# Patient Record
Sex: Female | Born: 1955 | Race: Black or African American | Hispanic: No | Marital: Single | Smoking: Current every day smoker
Health system: Southern US, Community
[De-identification: ages and names within clinical notes are randomized; demographics above are authoritative.]

## PROBLEM LIST (undated history)

## (undated) DIAGNOSIS — E119 Type 2 diabetes mellitus without complications: Secondary | ICD-10-CM

## (undated) DIAGNOSIS — R42 Dizziness and giddiness: Secondary | ICD-10-CM

## (undated) DIAGNOSIS — M549 Dorsalgia, unspecified: Secondary | ICD-10-CM

## (undated) DIAGNOSIS — I10 Essential (primary) hypertension: Secondary | ICD-10-CM

## (undated) HISTORY — PX: BACK SURGERY: SHX140

## (undated) HISTORY — PX: WRIST SURGERY: SHX841

## (undated) HISTORY — PX: ABDOMINAL HYSTERECTOMY: SHX81

---

## 2006-06-12 ENCOUNTER — Ambulatory Visit: Payer: Self-pay | Admitting: Gastroenterology

## 2006-09-21 ENCOUNTER — Ambulatory Visit: Payer: Self-pay | Admitting: Internal Medicine

## 2006-09-21 ENCOUNTER — Encounter (INDEPENDENT_AMBULATORY_CARE_PROVIDER_SITE_OTHER): Payer: Self-pay | Admitting: Internal Medicine

## 2006-09-21 LAB — CONVERTED CEMR LAB
AST: 26 units/L (ref 0–37)
Albumin: 3.7 g/dL (ref 3.5–5.2)
Alkaline Phosphatase: 167 units/L — ABNORMAL HIGH (ref 39–117)
BUN: 14 mg/dL (ref 6–23)
Creatinine, Ser: 0.89 mg/dL (ref 0.40–1.20)
Hemoglobin: 12.9 g/dL (ref 12.0–15.0)
MCHC: 33.2 g/dL (ref 30.0–36.0)
MCV: 92.8 fL (ref 78.0–100.0)
Potassium: 3.7 meq/L (ref 3.5–5.3)
RDW: 14.6 % — ABNORMAL HIGH (ref 11.5–14.0)
Total Bilirubin: 0.3 mg/dL (ref 0.3–1.2)

## 2006-11-01 ENCOUNTER — Ambulatory Visit: Payer: Self-pay | Admitting: Internal Medicine

## 2006-11-01 ENCOUNTER — Inpatient Hospital Stay (HOSPITAL_COMMUNITY)
Admission: AD | Admit: 2006-11-01 | Discharge: 2006-11-03 | Payer: Self-pay | Attending: Internal Medicine | Admitting: Internal Medicine

## 2006-11-01 ENCOUNTER — Ambulatory Visit: Payer: Self-pay | Admitting: Hospitalist

## 2010-12-18 ENCOUNTER — Encounter: Payer: Self-pay | Admitting: Gastroenterology

## 2010-12-28 ENCOUNTER — Other Ambulatory Visit: Payer: Self-pay

## 2010-12-28 DIAGNOSIS — Z139 Encounter for screening, unspecified: Secondary | ICD-10-CM

## 2011-01-02 ENCOUNTER — Ambulatory Visit (HOSPITAL_BASED_OUTPATIENT_CLINIC_OR_DEPARTMENT_OTHER): Payer: Self-pay

## 2011-01-04 ENCOUNTER — Ambulatory Visit (HOSPITAL_BASED_OUTPATIENT_CLINIC_OR_DEPARTMENT_OTHER)
Admission: RE | Admit: 2011-01-04 | Discharge: 2011-01-04 | Disposition: A | Discharge status: Home / Self Care | Payer: Self-pay | Source: Ambulatory Visit | Attending: Unknown Physician Specialty | Admitting: Unknown Physician Specialty

## 2011-01-04 ENCOUNTER — Encounter (HOSPITAL_BASED_OUTPATIENT_CLINIC_OR_DEPARTMENT_OTHER): Payer: Self-pay

## 2011-01-04 DIAGNOSIS — Z1231 Encounter for screening mammogram for malignant neoplasm of breast: Secondary | ICD-10-CM | POA: Insufficient documentation

## 2011-01-04 DIAGNOSIS — Z139 Encounter for screening, unspecified: Secondary | ICD-10-CM

## 2011-03-20 ENCOUNTER — Other Ambulatory Visit (HOSPITAL_BASED_OUTPATIENT_CLINIC_OR_DEPARTMENT_OTHER): Payer: Self-pay | Admitting: Unknown Physician Specialty

## 2011-03-20 DIAGNOSIS — Z139 Encounter for screening, unspecified: Secondary | ICD-10-CM

## 2016-11-15 ENCOUNTER — Emergency Department (HOSPITAL_BASED_OUTPATIENT_CLINIC_OR_DEPARTMENT_OTHER)
Admission: EM | Admit: 2016-11-15 | Discharge: 2016-11-15 | Disposition: A | Discharge status: Home / Self Care | Payer: No Typology Code available for payment source | Attending: Emergency Medicine | Admitting: Emergency Medicine

## 2016-11-15 ENCOUNTER — Encounter (HOSPITAL_BASED_OUTPATIENT_CLINIC_OR_DEPARTMENT_OTHER): Payer: Self-pay | Admitting: Emergency Medicine

## 2016-11-15 ENCOUNTER — Emergency Department (HOSPITAL_BASED_OUTPATIENT_CLINIC_OR_DEPARTMENT_OTHER): Payer: No Typology Code available for payment source

## 2016-11-15 DIAGNOSIS — Y939 Activity, unspecified: Secondary | ICD-10-CM | POA: Insufficient documentation

## 2016-11-15 DIAGNOSIS — I10 Essential (primary) hypertension: Secondary | ICD-10-CM | POA: Diagnosis not present

## 2016-11-15 DIAGNOSIS — Y92410 Unspecified street and highway as the place of occurrence of the external cause: Secondary | ICD-10-CM | POA: Diagnosis not present

## 2016-11-15 DIAGNOSIS — R079 Chest pain, unspecified: Secondary | ICD-10-CM | POA: Diagnosis not present

## 2016-11-15 DIAGNOSIS — Y990 Civilian activity done for income or pay: Secondary | ICD-10-CM | POA: Diagnosis not present

## 2016-11-15 DIAGNOSIS — E119 Type 2 diabetes mellitus without complications: Secondary | ICD-10-CM | POA: Insufficient documentation

## 2016-11-15 DIAGNOSIS — S299XXA Unspecified injury of thorax, initial encounter: Secondary | ICD-10-CM | POA: Diagnosis present

## 2016-11-15 DIAGNOSIS — Z79899 Other long term (current) drug therapy: Secondary | ICD-10-CM | POA: Insufficient documentation

## 2016-11-15 DIAGNOSIS — M542 Cervicalgia: Secondary | ICD-10-CM | POA: Insufficient documentation

## 2016-11-15 HISTORY — DX: Type 2 diabetes mellitus without complications: E11.9

## 2016-11-15 HISTORY — DX: Essential (primary) hypertension: I10

## 2016-11-15 MED ORDER — KETOROLAC TROMETHAMINE 60 MG/2ML IM SOLN
30.0000 mg | Freq: Once | INTRAMUSCULAR | Status: AC
Start: 2016-11-15 — End: 2016-11-15
  Administered 2016-11-15: 30 mg via INTRAMUSCULAR
  Filled 2016-11-15: qty 2

## 2016-11-15 MED ORDER — METHOCARBAMOL 500 MG PO TABS: 500.0000 mg | ORAL_TABLET | Freq: Two times a day (BID) | ORAL | 0 refills | Status: DC

## 2016-11-15 MED ORDER — IBUPROFEN 400 MG PO TABS
400.0000 mg | ORAL_TABLET | Freq: Four times a day (QID) | ORAL | 0 refills | Status: DC | PRN
Start: 2016-11-15 — End: 2018-10-23

## 2016-11-15 MED FILL — IBUPROFEN 400 MG TABLET: 400 | 7 days supply | Qty: 30 | Fill #0

## 2016-11-15 MED FILL — METHOCARBAMOL 500 MG TABLET: 500 | 10 days supply | Qty: 20 | Fill #0

## 2016-11-15 NOTE — ED Notes (Signed)
ED Provider at bedside. 
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
## 2016-11-15 NOTE — ED Notes (Signed)
Patient transported to X-ray 
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
## 2017-04-04 ENCOUNTER — Encounter (HOSPITAL_BASED_OUTPATIENT_CLINIC_OR_DEPARTMENT_OTHER): Payer: Self-pay | Admitting: *Deleted

## 2017-04-04 ENCOUNTER — Emergency Department (HOSPITAL_BASED_OUTPATIENT_CLINIC_OR_DEPARTMENT_OTHER)
Admission: EM | Admit: 2017-04-04 | Discharge: 2017-04-04 | Disposition: A | Discharge status: Home / Self Care | Payer: Self-pay | Attending: Emergency Medicine | Admitting: Emergency Medicine

## 2017-04-04 DIAGNOSIS — X58XXXA Exposure to other specified factors, initial encounter: Secondary | ICD-10-CM | POA: Insufficient documentation

## 2017-04-04 DIAGNOSIS — I10 Essential (primary) hypertension: Secondary | ICD-10-CM | POA: Insufficient documentation

## 2017-04-04 DIAGNOSIS — Y939 Activity, unspecified: Secondary | ICD-10-CM | POA: Insufficient documentation

## 2017-04-04 DIAGNOSIS — S0502XA Injury of conjunctiva and corneal abrasion without foreign body, left eye, initial encounter: Secondary | ICD-10-CM | POA: Insufficient documentation

## 2017-04-04 DIAGNOSIS — E119 Type 2 diabetes mellitus without complications: Secondary | ICD-10-CM | POA: Insufficient documentation

## 2017-04-04 DIAGNOSIS — Z79899 Other long term (current) drug therapy: Secondary | ICD-10-CM | POA: Insufficient documentation

## 2017-04-04 DIAGNOSIS — Y929 Unspecified place or not applicable: Secondary | ICD-10-CM | POA: Insufficient documentation

## 2017-04-04 DIAGNOSIS — Y999 Unspecified external cause status: Secondary | ICD-10-CM | POA: Insufficient documentation

## 2017-04-04 MED ORDER — ERYTHROMYCIN 5 MG/GM OP OINT: TOPICAL_OINTMENT | OPHTHALMIC | 0 refills | Status: DC

## 2017-04-04 MED ORDER — TETRACAINE HCL 0.5 % OP SOLN
1.0000 [drp] | Freq: Once | OPHTHALMIC | Status: AC
  Administered 2017-04-04: 1 [drp] via OPHTHALMIC
  Filled 2017-04-04: qty 4

## 2017-04-04 MED ORDER — FLUORESCEIN SODIUM 0.6 MG OP STRP
1.0000 | ORAL_STRIP | Freq: Once | OPHTHALMIC | Status: AC
  Administered 2017-04-04: 1 via OPHTHALMIC
  Filled 2017-04-04: qty 1

## 2017-04-04 NOTE — ED Provider Notes (Signed)
[facility]   CSN: [id number] Arrival date & time: 04/04/17  1300     History   Chief Complaint Chief Complaint  Patient presents with  . Eye Problem    HPI Becky James is a 61 y.o. female presenting with gradual onset of worsening left eye redness and itching. She also reports a foreign body sensation. She explains that she thought it was allergies and has taken her allergy medicine with relief. She states that she explains discomfort when she touches her eye. Denies any pain at this time. She denies visual changes, swelling, fever, chills, nausea, vomiting and doesn't wear contacts.  HPI  Past Medical History:  Diagnosis Date  . Diabetes mellitus without complication (HCC)   . Hypertension     There are no active problems to display for this patient.   Past Surgical History:  Procedure Laterality Date  . ABDOMINAL HYSTERECTOMY    . BACK SURGERY      OB History    No data available       Home Medications    Prior to Admission medications   Medication Sig Start Date End Date Taking? Authorizing Provider  amLODipine (NORVASC) 2.5 MG tablet Take 2.5 mg by mouth daily.    [provider]  cholecalciferol (VITAMIN D) 1000 units tablet Take 1,000 Units by mouth daily.    [provider]  erythromycin ophthalmic ointment Place a 1/2 inch ribbon of ointment into the lower eyelid. 04/04/17   Georgiana ShoreMitchell, Monta Police B, PA-C  ibuprofen (ADVIL,MOTRIN) 400 MG tablet Take 1 tablet (400 mg total) by mouth every 6 (six) hours as needed. 11/15/16   Mesner, Barbara CowerJason, MD  methocarbamol (ROBAXIN) 500 MG tablet Take 1 tablet (500 mg total) by mouth 2 (two) times daily. 11/15/16   Mesner, Barbara CowerJason, MD  metoprolol succinate (TOPROL-XL) 25 MG 24 hr tablet Take 25 mg by mouth daily.    [provider]  naproxen (NAPROSYN) 250 MG tablet Take 250 mg by mouth 2 (two) times daily with a meal.    [provider]    Family  History History reviewed. No pertinent family history.  Social History Social History  Substance Use Topics  . Smoking status: Never Smoker  . Smokeless tobacco: Never Used  . Alcohol use Not on file     Allergies   Patient has no known allergies.   Review of Systems Review of Systems  Constitutional: Negative for chills and fever.  HENT: Negative for ear pain and facial swelling.   Eyes: Positive for pain, discharge, redness and itching. Negative for photophobia and visual disturbance.  Respiratory: Negative for cough, shortness of breath and wheezing.   Cardiovascular: Negative for chest pain and palpitations.  Gastrointestinal: Negative for nausea and vomiting.  Musculoskeletal: Negative for arthralgias, back pain, myalgias, neck pain and neck stiffness.  Skin: Negative for color change, pallor and rash.  Neurological: Negative for seizures and syncope.  All other systems reviewed and are negative.    Physical Exam Updated Vital Signs BP (!) 142/74 (BP Location: Right Arm)   Pulse 62   Temp 98.3 F (36.8 C) (Oral)   Resp 18   Ht 5\' 7"  (1.702 m)   Wt 129.3 kg   SpO2 95%   BMI 44.64 kg/m   Physical Exam  Constitutional: She appears well-developed and well-nourished. No distress.  Patient is afebrile, nontoxic-appearing, sitting comfortably in chair in no acute distress.  HENT:  Head: Normocephalic and atraumatic.  Eyes: Conjunctivae and  EOM are normal. Pupils are equal, round, and reactive to light. Right eye exhibits no discharge. Left eye exhibits discharge. No scleral icterus.  Left sclerae is injected and some discharge noted in the lower lid. Pupils are round and equal and reactive to light. No edema of the upper or lower eyelid.  Neck: Normal range of motion. Neck supple.  Cardiovascular: Normal rate and regular rhythm.   No murmur heard. Pulmonary/Chest: Effort normal and breath sounds normal. No respiratory distress.  Musculoskeletal: She exhibits no  edema.  Neurological: She is alert.  Skin: Skin is warm and dry. No rash noted. She is not diaphoretic. No erythema. No pallor.  Psychiatric: She has a normal mood and affect.  Nursing note and vitals reviewed.    ED Treatments / Results  Labs (all labs ordered are listed, but only abnormal results are displayed) Labs Reviewed - No data to display  EKG  EKG Interpretation None       Radiology No results found.  Procedures Procedures (including critical care time)  Medications Ordered in ED Medications  fluorescein ophthalmic strip 1 strip (1 strip Left Eye Given 04/04/17 1518)  tetracaine (PONTOCAINE) 0.5 % ophthalmic solution 1 drop (1 drop Left Eye Given 04/04/17 1519)     Initial Impression / Assessment and Plan / ED Course  I have reviewed the triage vital signs and the nursing notes.  Pertinent labs & imaging results that were available during my care of the patient were reviewed by me and considered in my medical decision making (see chart for details).     Patient presents with left eye redness, itching and sensation of foreign body. Not a contact lens wearer. On eyelid eversion no foreign body visualized. Pupils are round and equal and reactive to light without photophobia. No change in vision.   Fluorescein dye and it was labs showing a small corneal abrasion at 3:00 left eye.  Discharge home with symptomatic relief and close follow-up with ophthalmology.  Discussed strict return precautions and advised to return to the emergency department if experiencing any new or worsening symptoms. Instructions were understood and patient agreed with discharge plan.  Final Clinical Impressions(s) / ED Diagnoses   Final diagnoses:  Abrasion of left cornea, initial encounter    New Prescriptions New Prescriptions   ERYTHROMYCIN OPHTHALMIC OINTMENT    Place a 1/2 inch ribbon of ointment into the lower eyelid.     Georgiana Shore, PA-C 04/04/17 1602    Melene Plan, DO 04/06/17 2323

## 2017-04-04 NOTE — Discharge Instructions (Signed)
As discussed, follow-up with the eye specialist. Use the ointment as instructed and cool wet compresses for relief. Try not to rub the eye or touch it.  Return to the emergency department if you experience changes in vision, increased pain, swelling or any other new concerning symptoms in the meantime.

## 2017-04-04 NOTE — ED Triage Notes (Signed)
pt c/o left eye redness x 2 weeks
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
## 2018-10-23 ENCOUNTER — Emergency Department (HOSPITAL_BASED_OUTPATIENT_CLINIC_OR_DEPARTMENT_OTHER)
Admission: EM | Admit: 2018-10-23 | Discharge: 2018-10-23 | Disposition: A | Discharge status: Home / Self Care | Payer: Self-pay | Attending: Emergency Medicine | Admitting: Emergency Medicine

## 2018-10-23 ENCOUNTER — Other Ambulatory Visit: Payer: Self-pay

## 2018-10-23 ENCOUNTER — Encounter (HOSPITAL_BASED_OUTPATIENT_CLINIC_OR_DEPARTMENT_OTHER): Payer: Self-pay | Admitting: Emergency Medicine

## 2018-10-23 DIAGNOSIS — Z79899 Other long term (current) drug therapy: Secondary | ICD-10-CM | POA: Insufficient documentation

## 2018-10-23 DIAGNOSIS — F17210 Nicotine dependence, cigarettes, uncomplicated: Secondary | ICD-10-CM | POA: Insufficient documentation

## 2018-10-23 DIAGNOSIS — G8929 Other chronic pain: Secondary | ICD-10-CM | POA: Insufficient documentation

## 2018-10-23 DIAGNOSIS — M5441 Lumbago with sciatica, right side: Secondary | ICD-10-CM | POA: Insufficient documentation

## 2018-10-23 HISTORY — DX: Dorsalgia, unspecified: M54.9

## 2018-10-23 HISTORY — DX: Dizziness and giddiness: R42

## 2018-10-23 MED ORDER — PREDNISONE 20 MG PO TABS: 40.0000 mg | ORAL_TABLET | Freq: Every day | ORAL | 0 refills | Status: DC

## 2018-10-23 MED ORDER — METHOCARBAMOL 500 MG PO TABS
500.0000 mg | ORAL_TABLET | Freq: Once | ORAL | Status: AC
  Administered 2018-10-23: 500 mg via ORAL
  Filled 2018-10-23: qty 1

## 2018-10-23 MED ORDER — METHOCARBAMOL 500 MG PO TABS: 500.0000 mg | ORAL_TABLET | Freq: Three times a day (TID) | ORAL | 0 refills | Status: DC | PRN

## 2018-10-23 MED FILL — predniSONE 20 MG TABS: 20 | 4 days supply | Qty: 8 | Fill #0

## 2018-10-23 MED FILL — METHOCARBAMOL 500 MG TABLET: 500 | 2 days supply | Qty: 8 | Fill #0

## 2018-10-23 NOTE — ED Notes (Signed)
Pt verbalized understanding of dc instructions.

## 2018-10-23 NOTE — ED Provider Notes (Signed)
[facility] EMERGENCY DEPARTMENT Provider Note   CSN: [id number] Arrival date & time: 10/23/18  [id number]     History   Chief Complaint Chief Complaint  Patient presents with  . Back Pain    HPI Becky James is a 62 y.o. female.  HPI Patient presents with acute on chronic low back pain.  Has had chronic pain that is been worse over the last few months.  Has been seen by her primary care doctor and had x-rays done.  Has had previous surgery.  States she has been worse the last day.  Thinks she may have overdone it.  No numbness or weakness.  Radiates down right leg.  Has a follow-up in December. Past Medical History:  Diagnosis Date  . Back pain   . Diabetes mellitus without complication (HCC)   . Hypertension   . Vertigo     There are no active problems to display for this patient.   Past Surgical History:  Procedure Laterality Date  . ABDOMINAL HYSTERECTOMY    . BACK SURGERY    . WRIST SURGERY       OB History   None      Home Medications    Prior to Admission medications   Medication Sig Start Date End Date Taking? Authorizing Provider  losartan-hydrochlorothiazide (HYZAAR) 50-12.5 MG tablet TAKE 1 Tablet BY MOUTH ONCE DAILY 03/13/18  Yes [provider]  meloxicam (MOBIC) 15 MG tablet Take by mouth. 06/30/16  Yes [provider]  atorvastatin (LIPITOR) 10 MG tablet Take 10 mg by mouth daily. 09/25/18   [provider]  cholecalciferol (VITAMIN D) 1000 units tablet Take 1,000 Units by mouth daily.    [provider]  gabapentin (NEURONTIN) 300 MG capsule TAKE 1 Capsule BY MOUTH EVERY MORNING AND TAKE 1 Capsule BY MOUTH EVERY EVENING 08/06/18   [provider]  methadone (DOLOPHINE) 10 MG/5ML solution Take by mouth.    [provider]  methocarbamol (ROBAXIN) 500 MG tablet Take 1 tablet (500 mg total) by mouth every 8 (eight) hours as needed for muscle spasms. 10/23/18   Benjiman CorePickering, Francoise Chojnowski, MD    metoprolol succinate (TOPROL-XL) 25 MG 24 hr tablet Take 25 mg by mouth daily.    [provider]  naproxen (NAPROSYN) 250 MG tablet Take 250 mg by mouth 2 (two) times daily with a meal.    [provider]  predniSONE (DELTASONE) 20 MG tablet Take 2 tablets (40 mg total) by mouth daily. 10/23/18   Benjiman CorePickering, Aurianna Earlywine, MD    Family History No family history on file.  Social History Social History   Tobacco Use  . Smoking status: Current Every Day Smoker    Packs/day: 0.50  . Smokeless tobacco: Never Used  Substance Use Topics  . Alcohol use: Never    Frequency: Never  . Drug use: Never     Allergies   Patient has no known allergies.   Review of Systems Review of Systems  Constitutional: Negative for appetite change.  Respiratory: Negative for shortness of breath.   Cardiovascular: Negative for chest pain.  Gastrointestinal: Negative for abdominal pain.  Genitourinary: Negative for flank pain.  Musculoskeletal: Positive for back pain.  Skin: Negative for rash.  Neurological: Negative for weakness and numbness.  Psychiatric/Behavioral: Negative for confusion.     Physical Exam Updated Vital Signs BP 140/69 (BP Location: Right Arm)   Pulse (!) 58   Temp 97.6 F (36.4 C) (Oral)   Resp 16  Ht 5\' 7"  (1.702 m)   Wt 131.5 kg   SpO2 96%   BMI 45.42 kg/m   Physical Exam  Constitutional: She appears well-developed.  HENT:  Head: Normocephalic.  Neck: Neck supple.  Cardiovascular: Normal rate.  Pulmonary/Chest: Effort normal.  Abdominal: There is no tenderness.  Musculoskeletal: She exhibits tenderness.  Right-sided lumbar paraspinal tenderness with muscle spasm.  Also some lumbar tenderness.  Some pain with straight leg raise bilaterally worse on right.  Normal flexion extension at the ankle.  Sensation intact in bilateral feet.  Neurological: She is alert.  Skin: Skin is warm. Capillary refill takes less than 2 seconds.     ED Treatments /  Results  Labs (all labs ordered are listed, but only abnormal results are displayed) Labs Reviewed - No data to display  EKG None  Radiology No results found.  Procedures Procedures (including critical care time)  Medications Ordered in ED Medications - No data to display   Initial Impression / Assessment and Plan / ED Course  I have reviewed the triage vital signs and the nursing notes.  Pertinent labs & imaging results that were available during my care of the patient were reviewed by me and considered in my medical decision making (see chart for details).     Patient with acute on chronic back pain.  No new injury.  No fevers.  Benign exam.  Will treat with steroids and muscle relaxer.  Has outpatient follow-up.  Discharge home. Final Clinical Impressions(s) / ED Diagnoses   Final diagnoses:  Chronic right-sided low back pain with right-sided sciatica    ED Discharge Orders         Ordered    predniSONE (DELTASONE) 20 MG tablet  Daily     10/23/18 0920    methocarbamol (ROBAXIN) 500 MG tablet  Every 8 hours PRN     10/23/18 0920           Benjiman Core, MD 10/23/18 [phone]

## 2018-10-23 NOTE — ED Triage Notes (Signed)
Chronic low right back pain that was exacerbated yesterday somehow - pt does not know how - and pain is radiating down anterior and posterior right leg.

## 2023-03-07 ENCOUNTER — Emergency Department (HOSPITAL_COMMUNITY): Payer: Medicare HMO

## 2023-03-07 ENCOUNTER — Emergency Department (HOSPITAL_COMMUNITY)
Admission: EM | Admit: 2023-03-07 | Discharge: 2023-03-07 | Disposition: A | Discharge status: Home / Self Care | Payer: Medicare HMO | Attending: Emergency Medicine | Admitting: Emergency Medicine

## 2023-03-07 ENCOUNTER — Other Ambulatory Visit: Payer: Self-pay

## 2023-03-07 ENCOUNTER — Encounter (HOSPITAL_COMMUNITY): Payer: Self-pay

## 2023-03-07 DIAGNOSIS — R079 Chest pain, unspecified: Secondary | ICD-10-CM | POA: Diagnosis present

## 2023-03-07 LAB — TROPONIN I (HIGH SENSITIVITY)
Troponin I (High Sensitivity): 12 ng/L (ref <18)
Troponin I (High Sensitivity): 15 ng/L (ref <18)

## 2023-03-07 LAB — CBC
HCT: 30.8 % — ABNORMAL LOW (ref 36.0–46.0)
Hemoglobin: 9.5 g/dL — ABNORMAL LOW (ref 12.0–15.0)
MCH: 30.1 pg (ref 26.0–34.0)
MCHC: 30.8 g/dL (ref 30.0–36.0)
MCV: 97.5 fL (ref 80.0–100.0)
Platelets: 343 10*3/uL (ref 150–400)
RBC: 3.16 MIL/uL — ABNORMAL LOW (ref 3.87–5.11)
RDW: 15.5 % (ref 11.5–15.5)
WBC: 12.9 10*3/uL — ABNORMAL HIGH (ref 4.0–10.5)
nRBC: 0.2 % (ref 0.0–0.2)

## 2023-03-07 LAB — BASIC METABOLIC PANEL
Anion gap: 8 (ref 5–15)
BUN: 22 mg/dL (ref 8–23)
CO2: 23 mmol/L (ref 22–32)
Calcium: 9.2 mg/dL (ref 8.9–10.3)
Chloride: 107 mmol/L (ref 98–111)
Creatinine, Ser: 1.59 mg/dL — ABNORMAL HIGH (ref 0.44–1.00)
GFR, Estimated: 35 mL/min — ABNORMAL LOW (ref >60)
Glucose, Bld: 117 mg/dL — ABNORMAL HIGH (ref 70–99)
Potassium: 3.6 mmol/L (ref 3.5–5.1)
Sodium: 138 mmol/L (ref 135–145)
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
## 2023-03-07 NOTE — ED Provider Notes (Signed)
[place] EMERGENCY DEPARTMENT AT [facility] Provider Note   CSN: [id number] Arrival date & time: 03/07/23  0930     History  Chief Complaint  Patient presents with   Chest Pain    Becky James is a 67 y.o. female.  67 yo F with a chief complaint of chest pain.  The patient states that this happened 2 days in a row right after she had something to eat.  She was at a alcohol and drug rehab program and had developed some symptoms.  She fell to go down the left arm.  Described as sharp.  Seem to resolve with nitroglycerin.  She had up calling EMS.  She has a cardiologist through what sounds like the [facility].  Tells me that she has had some tests that have to do with an ultrasound recently.    Patient denies history of MI, Has a hx of HTN, HLD, DM, is a smoker.  She thinks her mom maybe had heart problems but is unsure.  Patient denies history of PE or DVT denies hemoptysis denies unilateral lower extremity edema denies recent surgery immobilization hospitalization estrogen use or history of cancer.     Chest Pain      Home Medications Prior to Admission medications   Medication Sig Start Date End Date Taking? Authorizing Provider  atorvastatin (LIPITOR) 10 MG tablet Take 10 mg by mouth daily. 09/25/18   [provider]  cholecalciferol (VITAMIN D) 1000 units tablet Take 1,000 Units by mouth daily.    [provider]  gabapentin (NEURONTIN) 300 MG capsule TAKE 1 Capsule BY MOUTH EVERY MORNING AND TAKE 1 Capsule BY MOUTH EVERY EVENING 08/06/18   [provider]  losartan-hydrochlorothiazide (HYZAAR) 50-12.5 MG tablet TAKE 1 Tablet BY MOUTH ONCE DAILY 03/13/18   [provider]  meloxicam (MOBIC) 15 MG tablet Take by mouth. 06/30/16   [provider]  methadone (DOLOPHINE) 10 MG/5ML solution Take by mouth.    [provider]  methocarbamol (ROBAXIN) 500 MG tablet Take 1 tablet (500 mg total) by mouth  every 8 (eight) hours as needed for muscle spasms. 10/23/18   Benjiman Core, MD  metoprolol succinate (TOPROL-XL) 25 MG 24 hr tablet Take 25 mg by mouth daily.    [provider]  naproxen (NAPROSYN) 250 MG tablet Take 250 mg by mouth 2 (two) times daily with a meal.    [provider]  predniSONE (DELTASONE) 20 MG tablet Take 2 tablets (40 mg total) by mouth daily. 10/23/18   Benjiman Core, MD      Allergies    Patient has no known allergies.    Review of Systems   Review of Systems  Cardiovascular:  Positive for chest pain.    Physical Exam Updated Vital Signs BP 116/65   Pulse 80   Temp 97.9 F (36.6 C) (Oral)   Resp 16   SpO2 98%  Physical Exam Vitals and nursing note reviewed.  Constitutional:      General: She is not in acute distress.    Appearance: She is well-developed. She is not diaphoretic.  HENT:     Head: Normocephalic and atraumatic.  Eyes:     Pupils: Pupils are equal, round, and reactive to light.  Cardiovascular:     Rate and Rhythm: Normal rate and regular rhythm.     Heart sounds: No murmur heard.    No friction rub. No gallop.  Pulmonary:     Effort: Pulmonary effort  is normal.     Breath sounds: No wheezing or rales.  Chest:     Chest wall: Tenderness present.     Comments: Left-sided chest discomfort on palpation worse about the midclavicular line about ribs 4 through 6 reproduce her discomfort. Abdominal:     General: There is no distension.     Palpations: Abdomen is soft.     Tenderness: There is no abdominal tenderness.  Musculoskeletal:        General: No tenderness.     Cervical back: Normal range of motion and neck supple.  Skin:    General: Skin is warm and dry.  Neurological:     Mental Status: She is alert and oriented to person, place, and time.  Psychiatric:        Behavior: Behavior normal.     ED Results / Procedures / Treatments   Labs (all labs ordered are listed, but only abnormal results are  displayed) Labs Reviewed  BASIC METABOLIC PANEL - Abnormal; Notable for the following components:      Result Value   Glucose, Bld 117 (*)    Creatinine, Ser 1.59 (*)    GFR, Estimated 35 (*)    All other components within normal limits  CBC - Abnormal; Notable for the following components:   WBC 12.9 (*)    RBC 3.16 (*)    Hemoglobin 9.5 (*)    HCT 30.8 (*)    All other components within normal limits  CBG MONITORING, ED  TROPONIN I (HIGH SENSITIVITY)  TROPONIN I (HIGH SENSITIVITY)    EKG EKG Interpretation  Date/Time:  Wednesday March 07 2023 09:44:06 EDT Ventricular Rate:  83 PR Interval:  218 QRS Duration: 103 QT Interval:  377 QTC Calculation: 443 R Axis:   -25 Text Interpretation: Sinus rhythm Borderline prolonged PR interval Borderline left axis deviation Low voltage, precordial leads Abnormal T, consider ischemia, diffuse leads flipped t waves in I avl adn II perhaps due to baseline wander No significant change since last tracing Confirmed by Melene Plan [phone]) on 03/07/2023 11:06:14 AM  Radiology DG Chest Portable 1 View  Result Date: 03/07/2023 CLINICAL DATA:  Chest pain. EXAM: PORTABLE CHEST 1 VIEW COMPARISON:  Chest radiograph dated January 26, 2021 FINDINGS: The heart is mildly enlarged. Elevation of the right hemidiaphragm. Right mid lung linear atelectasis. No focal consolidation or pleural effusion. Spinal stimulator leads projecting over the lower thoracic spine. No acute osseous abnormality. IMPRESSION: 1. No active disease. 2. Elevation of the right hemidiaphragm with right mid lung linear atelectasis. 3. Mild cardiomegaly Electronically Signed   By: Larose Hires D.O.   On: 03/07/2023 10:38    Procedures Procedures    Medications Ordered in ED Medications - No data to display  ED Course/ Medical Decision Making/ A&P                             Medical Decision Making Amount and/or Complexity of Data Reviewed Labs: ordered. Radiology: ordered.   67  yo F with a chief complaints of right-sided chest discomfort.  This has been going on for couple days.  Seems occur with eating.  She denied any exertional symptoms.  Denied shortness of breath denied diaphoresis denied nausea or vomiting.  It is reproduced on exam.  Will obtain a delta troponin.  Reassess.  On my record review the patient has been seen through the [facility].  Her last stress test was  in 2020, it seems to be a preoperative study and I do not see any obvious cards notes from that time.  She saw cardiologist for the first time in September of last year, due to her poor mobility at baseline it was thought best to medically manage her.    Recent echo with no significant change to function.  2 troponins are negative.  No significant electrolyte abnormalities.  Patient's hemoglobin is less than it was last check in our system many years ago however when compared to her most recent labs in the [facility] is similar.  Results were discussed with patient.  Discharge home.  PCP/cards follow-up.  1:15 PM:  I have discussed the diagnosis/risks/treatment options with the patient.  Evaluation and diagnostic testing in the emergency department does not suggest an emergent condition requiring admission or immediate intervention beyond what has been performed at this time.  They will follow up with PCP, Cards. We also discussed returning to the ED immediately if new or worsening sx occur. We discussed the sx which are most concerning (e.g., sudden worsening pain, fever, inability to tolerate by mouth) that necessitate immediate return. Medications administered to the patient during their visit and any new prescriptions provided to the patient are listed below.  Medications given during this visit Medications - No data to display   The patient appears reasonably screen and/or stabilized for discharge and I doubt any other medical condition or other [facility] requiring further screening,  evaluation, or treatment in the ED at this time prior to discharge.         Final Clinical Impression(s) / ED Diagnoses Final diagnoses:  Nonspecific chest pain    Rx / DC Orders ED Discharge Orders     None         Melene PlanFloyd, Genavie Boettger, DO 03/07/23 1315

## 2023-03-07 NOTE — ED Notes (Signed)
Walked patient to the bathroom patient did well patient is now back in bed on the monitor with call bell in reach  

## 2023-03-07 NOTE — Discharge Instructions (Signed)
Try pepcid or tagamet up to twice a day.  Try to avoid things that may make this worse, most commonly these are spicy foods tomato based products fatty foods chocolate and peppermint.  Alcohol and tobacco can also make this worse.  Return to the emergency department for sudden worsening pain fever or inability to eat or drink.  Your markers here were negative for acute heart damage.  Please follow-up with your family doctor and cardiologist in the office.

## 2023-03-07 NOTE — ED Notes (Signed)
Got patient undressed into a gown on the monitor did EKG shown to Dr Adela Lank patient is resting with call bell in reach

## 2023-10-24 ENCOUNTER — Ambulatory Visit (INDEPENDENT_AMBULATORY_CARE_PROVIDER_SITE_OTHER): Payer: Medicare HMO | Admitting: Urology

## 2023-10-24 ENCOUNTER — Encounter: Payer: Self-pay | Admitting: Urology

## 2023-10-24 VITALS — BP 112/69 | HR 82 | Ht 67.0 in | Wt 285.0 lb

## 2023-10-24 DIAGNOSIS — Z87440 Personal history of urinary (tract) infections: Secondary | ICD-10-CM

## 2023-10-24 DIAGNOSIS — B9620 Unspecified Escherichia coli [E. coli] as the cause of diseases classified elsewhere: Secondary | ICD-10-CM | POA: Diagnosis not present

## 2023-10-24 DIAGNOSIS — R8290 Unspecified abnormal findings in urine: Secondary | ICD-10-CM | POA: Diagnosis not present

## 2023-10-24 DIAGNOSIS — N390 Urinary tract infection, site not specified: Secondary | ICD-10-CM

## 2023-10-24 LAB — BLADDER SCAN AMB NON-IMAGING

## 2023-10-24 LAB — MICROSCOPIC EXAMINATION

## 2023-10-24 LAB — URINALYSIS, ROUTINE W REFLEX MICROSCOPIC
Bilirubin, UA: NEGATIVE
Ketones, UA: NEGATIVE
Leukocytes,UA: NEGATIVE
Nitrite, UA: POSITIVE — AB
Protein,UA: NEGATIVE
Specific Gravity, UA: 1.02 (ref 1.005–1.030)
Urobilinogen, Ur: 0.2 mg/dL (ref 0.2–1.0)
pH, UA: 6 (ref 5.0–7.5)

## 2023-10-24 MED ORDER — CEFDINIR 300 MG PO CAPS
300.0000 mg | ORAL_CAPSULE | Freq: Two times a day (BID) | ORAL | 0 refills | Status: DC
Start: 2023-10-24 — End: 2023-10-29

## 2023-10-24 NOTE — Progress Notes (Signed)
Assessment: 1. Frequent UTI   2. Abnormal urine findings     Plan: I personally reviewed the patient's chart including provider notes, and lab results. Request all urine culture results from Dr. Chestine Spore for review. Urine culture sent today. Methods to reduce the risk of UTIs discussed with the patient including increase fluid intake, timed and double voiding, daily cranberry supplement, daily probiotic, and vaginal hormone replacement. Begin Cefdinir 300 mg BID x 7 days pending results of urine culture Will need to consider daily antibiotic for UTI prevention given frequency of UTI's. Return to office in 1 month  Chief Complaint:  Chief Complaint  Patient presents with   Frequent UTI    History of Present Illness:  Becky James is a 67 y.o. female who is seen in consultation from Princess Bruins, MD for evaluation of recurrent UTI's. She reports frequent UTIs for the past year.  These occur approximately monthly.  Typical symptoms include dysuria, frequency, urgency, and bladder pressure.  No gross hematuria or flank pain.  No fevers or chills.  Her symptoms typically resolve with antibiotics but returned shortly after she completes the antibiotics.  She is currently having some slight dysuria, frequency, and urgency.  Urine culture from 10/08/23 grew >100K E. Coli.   She was treated with Macrobid for this UTI. No other culture results available. She does report problems with constipation.  She is followed by nephrology for stage III kidney failure. CT imaging from 12/22 showed no renal or ureteral calculi.   Past Medical History:  Past Medical History:  Diagnosis Date   Back pain    Diabetes mellitus without complication (HCC)    Hypertension    Vertigo     Past Surgical History:  Past Surgical History:  Procedure Laterality Date   ABDOMINAL HYSTERECTOMY     BACK SURGERY     WRIST SURGERY      Allergies:  Allergies  Allergen Reactions   Furosemide Other  (See Comments)    Other Reaction(s): Gout  Causes gout attacks/flares    Family History:  No family history on file.  Social History:  Social History   Tobacco Use   Smoking status: Every Day    Current packs/day: 0.50    Types: Cigarettes   Smokeless tobacco: Never  Substance Use Topics   Alcohol use: Never   Drug use: Never    Review of symptoms:  Constitutional:  Negative for unexplained weight loss, night sweats, fever, chills ENT:  Negative for nose bleeds, sinus pain, painful swallowing CV:  Negative for chest pain, shortness of breath, exercise intolerance, palpitations, loss of consciousness Resp:  Negative for cough, wheezing, shortness of breath GI:  Negative for nausea, vomiting, diarrhea, bloody stools GU:  Positives noted in HPI; otherwise negative for gross hematuria, urinary incontinence Neuro:  Negative for seizures, poor balance, limb weakness, slurred speech Psych:  Negative for lack of energy, depression, anxiety Endocrine:  Negative for polydipsia, polyuria, symptoms of hypoglycemia (dizziness, hunger, sweating) Hematologic:  Negative for anemia, purpura, petechia, prolonged or excessive bleeding, use of anticoagulants  Allergic:  Negative for difficulty breathing or choking as a result of exposure to anything; no shellfish allergy; no allergic response (rash/itch) to materials, foods  Physical exam: BP 112/69   Pulse 82   Ht 5\' 7"  (1.702 m)   Wt 285 lb (129.3 kg)   BMI 44.64 kg/m  GENERAL APPEARANCE:  Well appearing, well developed, well nourished, NAD HEENT: Atraumatic, Normocephalic, oropharynx clear. NECK: Supple without lymphadenopathy or  thyromegaly. LUNGS: Clear to auscultation bilaterally. HEART: Regular Rate and Rhythm without murmurs, gallops, or rubs. ABDOMEN: Soft, non-tender, No Masses. EXTREMITIES: Moves all extremities well.  Without clubbing, cyanosis, or edema. NEUROLOGIC:  Alert and oriented x 3, in wheelchair, CN II-XII grossly  intact.  MENTAL STATUS:  Appropriate. BACK:  Non-tender to palpation.  No CVAT SKIN:  Warm, dry and intact.    Results: U/A:  6-10 WBC, many bacteria, nitrite +  PVR:  8 ml

## 2023-10-28 LAB — URINE CULTURE

## 2023-10-29 MED ORDER — AMOXICILLIN-POT CLAVULANATE 500-125 MG PO TABS
1.0000 | ORAL_TABLET | Freq: Two times a day (BID) | ORAL | 0 refills | Status: AC
Start: 2023-10-29 — End: 2023-11-05

## 2023-10-29 MED ORDER — NITROFURANTOIN MONOHYD MACRO 100 MG PO CAPS
100.0000 mg | ORAL_CAPSULE | Freq: Every day | ORAL | 2 refills | Status: DC
Start: 2023-10-29 — End: 2024-02-21

## 2023-10-29 NOTE — Addendum Note (Signed)
Addended by: Milderd Meager on: 10/29/2023 01:14 PM   Modules accepted: Orders

## 2023-11-27 ENCOUNTER — Encounter: Payer: Self-pay | Admitting: Urology

## 2023-11-27 ENCOUNTER — Ambulatory Visit (INDEPENDENT_AMBULATORY_CARE_PROVIDER_SITE_OTHER): Payer: Medicare HMO | Admitting: Urology

## 2023-11-27 VITALS — BP 130/80 | HR 86 | Ht 67.0 in | Wt 285.0 lb

## 2023-11-27 DIAGNOSIS — Z87442 Personal history of urinary calculi: Secondary | ICD-10-CM | POA: Diagnosis not present

## 2023-11-27 DIAGNOSIS — Z09 Encounter for follow-up examination after completed treatment for conditions other than malignant neoplasm: Secondary | ICD-10-CM | POA: Diagnosis not present

## 2023-11-27 DIAGNOSIS — N390 Urinary tract infection, site not specified: Secondary | ICD-10-CM

## 2023-11-27 LAB — URINALYSIS, ROUTINE W REFLEX MICROSCOPIC
Bilirubin, UA: NEGATIVE
Ketones, UA: NEGATIVE
Leukocytes,UA: NEGATIVE
Nitrite, UA: NEGATIVE
RBC, UA: NEGATIVE
Specific Gravity, UA: 1.025 (ref 1.005–1.030)
Urobilinogen, Ur: 0.2 mg/dL (ref 0.2–1.0)
pH, UA: 6 (ref 5.0–7.5)

## 2023-11-27 LAB — MICROSCOPIC EXAMINATION: Epithelial Cells (non renal): >10 /[HPF] — AB (ref 0–10)

## 2023-11-27 NOTE — Progress Notes (Signed)
   Assessment: 1. Frequent UTI     Plan: Continue methods to reduce the risk of UTIs including increased fluid intake, timed and double voiding, daily cranberry supplement, daily probiotic, and vaginal hormone replacement. Continue daily Macrodantin  for UTI prevention. Return to office in 6 weeks.  Chief Complaint:  Chief Complaint  Patient presents with   Frequent UTI    History of Present Illness:  Becky James is a 67 y.o. female who is seen for continued evaluation of recurrent UTI's. She reported frequent UTIs for the past year.  These UTI's have occurred approximately monthly.  Typical symptoms include dysuria, frequency, urgency, and bladder pressure.  No gross hematuria or flank pain.  No fevers or chills.  Her symptoms typically resolve with antibiotics but return shortly after she completes the antibiotics.  At her visit in 11/24, she was having some slight dysuria, frequency, and urgency.  Urine culture results: 6/21 >100K  E. Coli 7/22 >100K  E. Coli 8/22 >100K  Citrobacter 6/23 >100K  E. Coli 9/23 >100K  E. Coli 12/23 >100K  E. Coli 9/24 >100K  E. Coli 10/24 >100K  Proteus 11/24 >100K  E. Coli 11/24 >100K E. Coli (ESBL) - treated with Augmentin   She does report problems with constipation.  She is followed by nephrology for stage III kidney failure. CT imaging from 12/22 showed no renal or ureteral calculi.  She returns today for follow-up.  She is currently on daily Macrodantin  for UTI prevention.  She is not having any UTI symptoms at the present time.  She is tolerating the daily antibiotic well.  Portions of the above documentation were copied from a prior visit for review purposes only.  Past Medical History:  Past Medical History:  Diagnosis Date   Back pain    Diabetes mellitus without complication (HCC)    Hypertension    Vertigo     Past Surgical History:  Past Surgical History:  Procedure Laterality Date   ABDOMINAL HYSTERECTOMY      BACK SURGERY     WRIST SURGERY      Allergies:  Allergies  Allergen Reactions   Furosemide Other (See Comments)    Other Reaction(s): Gout  Causes gout attacks/flares    Family History:  No family history on file.  Social History:  Social History   Tobacco Use   Smoking status: Every Day    Current packs/day: 0.50    Types: Cigarettes   Smokeless tobacco: Never  Substance Use Topics   Alcohol use: Never   Drug use: Never    ROS: Constitutional:  Negative for fever, chills, weight loss CV: Negative for chest pain, previous MI, hypertension Respiratory:  Negative for shortness of breath, wheezing, sleep apnea, frequent cough GI:  Negative for nausea, vomiting, bloody stool, GERD  Physical exam: BP 130/80   Pulse 86   Ht 5' 7 (1.702 m)   Wt 285 lb (129.3 kg)   BMI 44.64 kg/m  GENERAL APPEARANCE:  Well appearing, well developed, well nourished, NAD HEENT:  Atraumatic, normocephalic, oropharynx clear NECK:  Supple without lymphadenopathy or thyromegaly ABDOMEN:  Soft, non-tender, no masses EXTREMITIES:  Moves all extremities well, without clubbing, cyanosis, or edema NEUROLOGIC:  Alert and oriented x 3, in wheelchair, CN II-XII grossly intact MENTAL STATUS:  appropriate BACK:  Non-tender to palpation, No CVAT SKIN:  Warm, dry, and intact  Results: U/A:  0-5 WBC, 0-2 RBC, >10 epithelial cells, + yeast

## 2024-01-08 ENCOUNTER — Ambulatory Visit (INDEPENDENT_AMBULATORY_CARE_PROVIDER_SITE_OTHER): Payer: No Typology Code available for payment source | Admitting: Urology

## 2024-01-08 ENCOUNTER — Encounter: Payer: Self-pay | Admitting: Urology

## 2024-01-08 VITALS — BP 132/79 | HR 83 | Ht 67.0 in | Wt 285.0 lb

## 2024-01-08 DIAGNOSIS — N760 Acute vaginitis: Secondary | ICD-10-CM

## 2024-01-08 DIAGNOSIS — B3731 Acute candidiasis of vulva and vagina: Secondary | ICD-10-CM | POA: Diagnosis not present

## 2024-01-08 DIAGNOSIS — N390 Urinary tract infection, site not specified: Secondary | ICD-10-CM

## 2024-01-08 DIAGNOSIS — Z87440 Personal history of urinary (tract) infections: Secondary | ICD-10-CM | POA: Diagnosis not present

## 2024-01-08 LAB — MICROSCOPIC EXAMINATION
Renal Epithel, UA: NONE SEEN /[HPF]
WBC, UA: NONE SEEN /[HPF] (ref 0–5)

## 2024-01-08 LAB — URINALYSIS, ROUTINE W REFLEX MICROSCOPIC
Bilirubin, UA: NEGATIVE
Ketones, UA: NEGATIVE
Leukocytes,UA: NEGATIVE
Nitrite, UA: NEGATIVE
Specific Gravity, UA: 1.02 (ref 1.005–1.030)
Urobilinogen, Ur: 0.2 mg/dL (ref 0.2–1.0)
pH, UA: 6 (ref 5.0–7.5)

## 2024-01-08 MED ORDER — MONISTAT 3 COMBINATION PACK 200-2 MG-% VA KIT
PACK | VAGINAL | 0 refills | Status: AC
Start: 2024-01-08 — End: ?

## 2024-01-08 NOTE — Progress Notes (Signed)
Assessment: 1. Frequent UTI   2. Yeast vaginitis     Plan: Continue methods to reduce the risk of UTIs including increased fluid intake, timed and double voiding, daily cranberry supplement, daily probiotic, and vaginal hormone replacement. Continue daily Macrodantin for UTI prevention for current supply is gone. Monistat 3 day pack for yeast vaginitis Return to office in 6 weeks.  Chief Complaint:  Chief Complaint  Patient presents with   Frequent UTI    History of Present Illness:  Becky James is a 68 y.o. female who is seen for continued evaluation of recurrent UTI's. She reported frequent UTIs for the past year.  These UTI's have occurred approximately monthly.  Typical symptoms include dysuria, frequency, urgency, and bladder pressure.  No gross hematuria or flank pain.  No fevers or chills.  Her symptoms typically resolve with antibiotics but return shortly after she completes the antibiotics.  At her visit in 11/24, she was having some slight dysuria, frequency, and urgency.  Urine culture results: 6/21 >100K  E. Coli 7/22 >100K  E. Coli 8/22 >100K  Citrobacter 6/23 >100K  E. Coli 9/23 >100K  E. Coli 12/23 >100K  E. Coli 9/24 >100K  E. Coli 10/24 >100K  Proteus 11/24 >100K  E. Coli 11/24 >100K E. Coli (ESBL) - treated with Augmentin  She does report problems with constipation.  She is followed by nephrology for stage III kidney failure. CT imaging from 12/22 showed no renal or ureteral calculi.  At her visit in 12/24, she continued on daily Macrodantin for UTI prevention.  No UTI symptoms.  She returns today for scheduled follow-up.  No recent UTI symptoms.  No dysuria or gross hematuria.  She continues taking the daily Macrobid.  She had has noted some slight vaginal irritation and itching.  No discharge.  Portions of the above documentation were copied from a prior visit for review purposes only.  Past Medical History:  Past Medical History:   Diagnosis Date   Back pain    Diabetes mellitus without complication (HCC)    Hypertension    Vertigo     Past Surgical History:  Past Surgical History:  Procedure Laterality Date   ABDOMINAL HYSTERECTOMY     BACK SURGERY     WRIST SURGERY      Allergies:  Allergies  Allergen Reactions   Famotidine    Furosemide Other (See Comments)    Other Reaction(s): Gout  Causes gout attacks/flares   Rosuvastatin     Other Reaction(s): Headache, Other (See Comments)   Sulfamethoxazole-Trimethoprim     Family History:  History reviewed. No pertinent family history.  Social History:  Social History   Tobacco Use   Smoking status: Every Day    Current packs/day: 0.50    Types: Cigarettes   Smokeless tobacco: Never  Substance Use Topics   Alcohol use: Never   Drug use: Never    ROS: Constitutional:  Negative for fever, chills, weight loss CV: Negative for chest pain, previous MI, hypertension Respiratory:  Negative for shortness of breath, wheezing, sleep apnea, frequent cough GI:  Negative for nausea, vomiting, bloody stool, GERD  Physical exam: BP 132/79   Pulse 83   Ht 5\' 7"  (1.702 m)   Wt 285 lb (129.3 kg)   BMI 44.64 kg/m  GENERAL APPEARANCE:  Well appearing, well developed, well nourished, NAD HEENT:  Atraumatic, normocephalic, oropharynx clear NECK:  Supple without lymphadenopathy or thyromegaly ABDOMEN:  Soft, non-tender, no masses EXTREMITIES:  Moves all extremities well,  without clubbing, cyanosis, or edema NEUROLOGIC:  Alert and oriented x 3, in wheelchair, CN II-XII grossly intact MENTAL STATUS:  appropriate BACK:  Non-tender to palpation, No CVAT SKIN:  Warm, dry, and intact  Results: U/A: 0 WBCs, 0-2 RBCs, 4+ yeast

## 2024-02-19 ENCOUNTER — Encounter: Payer: Self-pay | Admitting: Urology

## 2024-02-19 ENCOUNTER — Ambulatory Visit (INDEPENDENT_AMBULATORY_CARE_PROVIDER_SITE_OTHER): Payer: No Typology Code available for payment source | Admitting: Urology

## 2024-02-19 VITALS — BP 124/77 | HR 82 | Ht 67.0 in | Wt 280.0 lb

## 2024-02-19 DIAGNOSIS — R8290 Unspecified abnormal findings in urine: Secondary | ICD-10-CM

## 2024-02-19 DIAGNOSIS — N390 Urinary tract infection, site not specified: Secondary | ICD-10-CM

## 2024-02-19 DIAGNOSIS — Z87440 Personal history of urinary (tract) infections: Secondary | ICD-10-CM | POA: Diagnosis not present

## 2024-02-19 LAB — URINALYSIS, ROUTINE W REFLEX MICROSCOPIC
Bilirubin, UA: NEGATIVE
Ketones, UA: NEGATIVE
Leukocytes,UA: NEGATIVE
Nitrite, UA: POSITIVE — AB
Specific Gravity, UA: 1.02 (ref 1.005–1.030)
Urobilinogen, Ur: 0.2 mg/dL (ref 0.2–1.0)
pH, UA: 6 (ref 5.0–7.5)

## 2024-02-19 LAB — MICROSCOPIC EXAMINATION

## 2024-02-19 NOTE — Progress Notes (Signed)
 Assessment: 1. Frequent UTI   2. Abnormal urine findings     Plan: Urine culture sent today.  Will contact her with results. Continue methods to reduce the risk of UTIs including increased fluid intake, timed and double voiding, daily cranberry supplement, daily probiotic, and vaginal hormone replacement. Return to office in 6-8 weeks.  Chief Complaint:  Chief Complaint  Patient presents with   Frequent UTI    History of Present Illness:  Becky James is a 68 y.o. female who is seen for continued evaluation of recurrent UTI's. She reported frequent UTIs for the past year.  These UTI's have occurred approximately monthly.  Typical symptoms include dysuria, frequency, urgency, and bladder pressure.  No gross hematuria or flank pain.  No fevers or chills.  Her symptoms typically resolve with antibiotics but return shortly after she completes the antibiotics.  At her visit in 11/24, she was having some slight dysuria, frequency, and urgency.  Urine culture results: 6/21 >100K  E. Coli 7/22 >100K  E. Coli 8/22 >100K  Citrobacter 6/23 >100K  E. Coli 9/23 >100K  E. Coli 12/23 >100K  E. Coli 9/24 >100K  E. Coli 10/24 >100K  Proteus 11/24 >100K  E. Coli 11/24 >100K E. Coli (ESBL) - treated with Augmentin  She does report problems with constipation.  She is followed by nephrology for stage III kidney failure. CT imaging from 12/22 showed no renal or ureteral calculi.  At her visit in 12/24, she continued on daily Macrodantin for UTI prevention.  No UTI symptoms. At her visit in 2/25, she had not had any recent UTI symptoms.  No dysuria or gross hematuria.  She continued taking the daily Macrobid.  She had noted some slight vaginal irritation and itching.  No discharge. She continued on the daily Macrobid until her supply ran out.  She was treated for yeast vaginitis with Monistat.  She returns today for follow-up.  She ran out of the daily Macrobid approximately 1 week ago.   She does report some frequency, urgency, intermittent stream.  No dysuria or gross hematuria.  Portions of the above documentation were copied from a prior visit for review purposes only.  Past Medical History:  Past Medical History:  Diagnosis Date   Back pain    Diabetes mellitus without complication (HCC)    Hypertension    Vertigo     Past Surgical History:  Past Surgical History:  Procedure Laterality Date   ABDOMINAL HYSTERECTOMY     BACK SURGERY     WRIST SURGERY      Allergies:  Allergies  Allergen Reactions   Furosemide Other (See Comments)    Other Reaction(s): Gout  Causes gout attacks/flares   Rosuvastatin     Other Reaction(s): Headache, Other (See Comments)    Family History:  History reviewed. No pertinent family history.  Social History:  Social History   Tobacco Use   Smoking status: Every Day    Current packs/day: 0.50    Types: Cigarettes   Smokeless tobacco: Never  Substance Use Topics   Alcohol use: Never   Drug use: Never    ROS: Constitutional:  Negative for fever, chills, weight loss CV: Negative for chest pain, previous MI, hypertension Respiratory:  Negative for shortness of breath, wheezing, sleep apnea, frequent cough GI:  Negative for nausea, vomiting, bloody stool, GERD  Physical exam: BP 124/77   Pulse 82   Ht 5\' 7"  (1.702 m)   Wt 280 lb (127 kg)   BMI  43.85 kg/m  GENERAL APPEARANCE:  Well appearing, well developed, well nourished, NAD HEENT:  Atraumatic, normocephalic, oropharynx clear NECK:  Supple without lymphadenopathy or thyromegaly ABDOMEN:  Soft, non-tender, no masses EXTREMITIES:  Moves all extremities well, without clubbing, cyanosis, or edema NEUROLOGIC:  Alert and oriented x 3, in wheelchair, CN II-XII grossly intact MENTAL STATUS:  appropriate BACK:  Non-tender to palpation, No CVAT SKIN:  Warm, dry, and intact  Results: U/A: 6-10 WBCs, 0-2 RBCs, many bacteria, nitrite positive

## 2024-02-21 LAB — URINE CULTURE

## 2024-02-21 MED ORDER — NITROFURANTOIN MONOHYD MACRO 100 MG PO CAPS
100.0000 mg | ORAL_CAPSULE | Freq: Every day | ORAL | 2 refills | Status: DC
Start: 2024-02-21 — End: 2024-04-28

## 2024-02-21 MED ORDER — NITROFURANTOIN MONOHYD MACRO 100 MG PO CAPS
100.0000 mg | ORAL_CAPSULE | Freq: Two times a day (BID) | ORAL | 0 refills | Status: AC
Start: 2024-02-21 — End: 2024-02-28

## 2024-02-21 NOTE — Addendum Note (Signed)
 Addended by: Milderd Meager on: 02/21/2024 04:49 PM   Modules accepted: Orders

## 2024-04-21 NOTE — Progress Notes (Unsigned)
 Assessment: 1. Frequent UTI   2. Abnormal urine findings     Plan: [facility] urine culture sent today.  Will contact her with results. Continue methods to reduce the risk of UTIs including increased fluid intake, timed and double voiding, daily cranberry supplement, daily probiotic, and vaginal hormone replacement. Continue daily Macrobid  pending cx results Return to office in 2 months Consider cystoscopy if she continues to have UTI's despite antibiotic prophylaxis  Chief Complaint:  Chief Complaint  Patient presents with   Frequent UTI    History of Present Illness:  Becky James is a 68 y.o. female who is seen for continued evaluation of recurrent UTI's. She reported frequent UTIs for the past year.  These UTI's have occurred approximately monthly.  Typical symptoms include dysuria, frequency, urgency, and bladder pressure.  No gross hematuria or flank pain.  No fevers or chills.  Her symptoms typically resolve with antibiotics but return shortly after she completes the antibiotics.  At her visit in 11/24, she was having some slight dysuria, frequency, and urgency.  Urine culture results: 6/21 >100K  E. Coli 7/22 >100K  E. Coli 8/22 >100K  Citrobacter 6/23 >100K  E. Coli 9/23 >100K  E. Coli 12/23 >100K  E. Coli 9/24 >100K  E. Coli 10/24 >100K  Proteus 11/24 >100K  E. Coli 11/24 >100K E. Coli (ESBL) - treated with Augmentin   She does report problems with constipation.  She is followed by nephrology for stage III kidney failure. CT imaging from 12/22 showed no renal or ureteral calculi.  At her visit in 12/24, she continued on daily Macrodantin  for UTI prevention.  No UTI symptoms. At her visit in 2/25, she had not had any recent UTI symptoms.  No dysuria or gross hematuria.  She continued taking the daily Macrobid .  She had noted some slight vaginal irritation and itching.  No discharge. She continued on the daily Macrobid  until her supply ran out.  She was  treated for yeast vaginitis with Monistat .  She ran out of the daily Macrobid  approximately 1 week prior to her visit in March 2025.  She had some frequency, urgency, intermittent stream.  No dysuria or gross hematuria. Urine culture grew >100K E. Coli.  Treated with macrobid  and restarted on daily Macrobid .  She returns today for follow-up.  She continues on daily Macrodantin .  No dysuria or gross hematuria.  She does have frequency, urgency, and urge incontinence.    Portions of the above documentation were copied from a prior visit for review purposes only.  Past Medical History:  Past Medical History:  Diagnosis Date   Back pain    Diabetes mellitus without complication (HCC)    Hypertension    Vertigo     Past Surgical History:  Past Surgical History:  Procedure Laterality Date   ABDOMINAL HYSTERECTOMY     BACK SURGERY     WRIST SURGERY      Allergies:  Allergies  Allergen Reactions   Empagliflozin Other (See Comments)    Frequent UTI's, yeast infections and perianal fungal appearing rash.   Furosemide Other (See Comments)    Other Reaction(s): Gout  Causes gout attacks/flares   Rosuvastatin     Other Reaction(s): Headache, Other (See Comments)    Family History:  No family history on file.  Social History:  Social History   Tobacco Use   Smoking status: Every Day    Current packs/day: 0.50    Types: Cigarettes   Smokeless tobacco: Never  Substance  Use Topics   Alcohol use: Never   Drug use: Never    ROS: Constitutional:  Negative for fever, chills, weight loss CV: Negative for chest pain, previous MI, hypertension Respiratory:  Negative for shortness of breath, wheezing, sleep apnea, frequent cough GI:  Negative for nausea, vomiting, bloody stool, GERD  Physical exam: BP 129/75   Pulse 82   Ht 5\' 7"  (1.702 m)   Wt 280 lb (127 kg)   BMI 43.85 kg/m  GENERAL APPEARANCE:  Well appearing, well developed, well nourished, NAD HEENT:  Atraumatic,  normocephalic, oropharynx clear NECK:  Supple  ABDOMEN:  Soft, non-tender, no masses EXTREMITIES: Without clubbing, cyanosis, or edema NEUROLOGIC:  Alert and oriented x 3, CN II-XII grossly intact MENTAL STATUS:  appropriate BACK:  Non-tender to palpation, No CVAT SKIN:  Warm, dry, and intact  Results: U/A: 11-30 WBCs, 0-2 RBCs, many bacteria, nitrite positive

## 2024-04-22 ENCOUNTER — Ambulatory Visit (INDEPENDENT_AMBULATORY_CARE_PROVIDER_SITE_OTHER): Admitting: Urology

## 2024-04-22 ENCOUNTER — Encounter: Payer: Self-pay | Admitting: Urology

## 2024-04-22 VITALS — BP 129/75 | HR 82 | Ht 67.0 in | Wt 280.0 lb

## 2024-04-22 DIAGNOSIS — N390 Urinary tract infection, site not specified: Secondary | ICD-10-CM

## 2024-04-22 DIAGNOSIS — R8290 Unspecified abnormal findings in urine: Secondary | ICD-10-CM | POA: Diagnosis not present

## 2024-04-22 DIAGNOSIS — Z87440 Personal history of urinary (tract) infections: Secondary | ICD-10-CM | POA: Diagnosis not present

## 2024-04-22 LAB — URINALYSIS, ROUTINE W REFLEX MICROSCOPIC
Bilirubin, UA: NEGATIVE
Glucose, UA: NEGATIVE
Ketones, UA: NEGATIVE
Nitrite, UA: POSITIVE — AB
Specific Gravity, UA: 1.02 (ref 1.005–1.030)
Urobilinogen, Ur: 0.2 mg/dL (ref 0.2–1.0)
pH, UA: 7 (ref 5.0–7.5)

## 2024-04-22 LAB — MICROSCOPIC EXAMINATION

## 2024-04-28 ENCOUNTER — Encounter: Payer: Self-pay | Admitting: Urology

## 2024-04-28 ENCOUNTER — Telehealth: Payer: Self-pay | Admitting: Urology

## 2024-04-28 DIAGNOSIS — N390 Urinary tract infection, site not specified: Secondary | ICD-10-CM

## 2024-04-28 MED ORDER — CEPHALEXIN 500 MG PO CAPS: 500.0000 mg | ORAL_CAPSULE | Freq: Every day | ORAL | 2 refills | Status: DC

## 2024-04-28 MED ORDER — CEFDINIR 300 MG PO CAPS: 300.0000 mg | ORAL_CAPSULE | Freq: Two times a day (BID) | ORAL | 0 refills | Status: AC

## 2024-04-28 NOTE — Telephone Encounter (Signed)
 [facility] culture showed Proteus.   Will treat with Cefdinir  x 7 days then begin daily Keflex for UTI prevention.

## 2024-04-28 NOTE — Telephone Encounter (Signed)
 Spoke with pt in reference to abx changes. Pt voiced understanding. Will also send instructions to pt's mychart.

## 2024-06-23 ENCOUNTER — Ambulatory Visit (INDEPENDENT_AMBULATORY_CARE_PROVIDER_SITE_OTHER): Admitting: Urology

## 2024-06-23 ENCOUNTER — Encounter: Payer: Self-pay | Admitting: Urology

## 2024-06-23 VITALS — BP 155/87 | HR 81 | Ht 67.0 in | Wt 275.0 lb

## 2024-06-23 DIAGNOSIS — Z87440 Personal history of urinary (tract) infections: Secondary | ICD-10-CM

## 2024-06-23 DIAGNOSIS — N390 Urinary tract infection, site not specified: Secondary | ICD-10-CM

## 2024-06-23 DIAGNOSIS — R8290 Unspecified abnormal findings in urine: Secondary | ICD-10-CM | POA: Diagnosis not present

## 2024-06-23 LAB — MICROSCOPIC EXAMINATION

## 2024-06-23 LAB — URINALYSIS, ROUTINE W REFLEX MICROSCOPIC
Bilirubin, UA: NEGATIVE
Glucose, UA: NEGATIVE
Ketones, UA: NEGATIVE
Leukocytes,UA: NEGATIVE
Nitrite, UA: POSITIVE — AB
Specific Gravity, UA: 1.02 (ref 1.005–1.030)
Urobilinogen, Ur: 0.2 mg/dL (ref 0.2–1.0)
pH, UA: 6 (ref 5.0–7.5)

## 2024-06-23 MED ORDER — CEPHALEXIN 500 MG PO CAPS: 500.0000 mg | ORAL_CAPSULE | Freq: Every day | ORAL | 2 refills | Status: DC

## 2024-06-23 NOTE — Progress Notes (Signed)
 Assessment: 1. Frequent UTI   2. Abnormal urine findings     Plan: Continue methods to reduce the risk of UTIs including increased fluid intake, timed and double voiding, daily cranberry supplement, daily probiotic, and vaginal hormone replacement. Continue daily Keflex  Urine culture sent today.  She is currently asymptomatic. Return to office in 3 months Consider cystoscopy if she continues to have UTI's while on antibiotic prophylaxis  Chief Complaint:  Chief Complaint  Patient presents with   Frequent UTI    History of Present Illness:  Becky James is a 68 y.o. female who is seen for continued evaluation of recurrent UTI's. She reported frequent UTIs for the past year.  These UTI's have occurred approximately monthly.  Typical symptoms include dysuria, frequency, urgency, and bladder pressure.  No gross hematuria or flank pain.  No fevers or chills.  Her symptoms typically resolve with antibiotics but return shortly after she completes the antibiotics.  At her visit in 11/24, she was having some slight dysuria, frequency, and urgency.  Urine culture results: 6/21 >100K  E. Coli 7/22 >100K  E. Coli 8/22 >100K  Citrobacter 6/23 >100K  E. Coli 9/23 >100K  E. Coli 12/23 >100K  E. Coli 9/24 >100K  E. Coli 10/24 >100K  Proteus 11/24 >100K  E. Coli 11/24 >100K E. Coli (ESBL) - treated with Augmentin   She does report problems with constipation.  She is followed by nephrology for stage III kidney failure. CT imaging from 12/22 showed no renal or ureteral calculi.  At her visit in 12/24, she continued on daily Macrodantin  for UTI prevention.  No UTI symptoms. At her visit in 2/25, she had not had any recent UTI symptoms.  No dysuria or gross hematuria.  She continued taking the daily Macrobid .  She had noted some slight vaginal irritation and itching.  No discharge. She continued on the daily Macrobid  until her supply ran out.  She was treated for yeast vaginitis with  Monistat .  She ran out of the daily Macrobid  approximately 1 week prior to her visit in March 2025.  She had some frequency, urgency, intermittent stream.  No dysuria or gross hematuria. Urine culture grew >100K E. Coli.  Treated with macrobid  and restarted on daily Macrobid .  At her visit in May 2025, she continued on daily Macrodantin .  No dysuria or gross hematuria.  She reported frequency, urgency, and urge incontinence.   [facility] urine culture from 04/22/24:  Proteus.  Treated with Cefdinir  x 7 days and started on daily Keflex .  She returns today for follow-up.  She continues on daily cephalexin .  She is not having any current UTI symptoms.  No frequency, dysuria, or gross hematuria.  No flank pain.  Portions of the above documentation were copied from a prior visit for review purposes only.  Past Medical History:  Past Medical History:  Diagnosis Date   Back pain    Diabetes mellitus without complication (HCC)    Hypertension    Vertigo     Past Surgical History:  Past Surgical History:  Procedure Laterality Date   ABDOMINAL HYSTERECTOMY     BACK SURGERY     WRIST SURGERY      Allergies:  Allergies  Allergen Reactions   Empagliflozin Other (See Comments)    Frequent UTI's, yeast infections and perianal fungal appearing rash.   Furosemide Other (See Comments)    Other Reaction(s): Gout  Causes gout attacks/flares   Rosuvastatin     Other Reaction(s): Headache, Other (See  Comments)    Family History:  No family history on file.  Social History:  Social History   Tobacco Use   Smoking status: Every Day    Current packs/day: 0.50    Types: Cigarettes   Smokeless tobacco: Never  Substance Use Topics   Alcohol use: Never   Drug use: Never    ROS: Constitutional:  Negative for fever, chills, weight loss CV: Negative for chest pain, previous MI, hypertension Respiratory:  Negative for shortness of breath, wheezing, sleep apnea, frequent cough GI:   Negative for nausea, vomiting, bloody stool, GERD  Physical exam: BP (!) 155/87   Pulse 81   Ht 5' 7 (1.702 m)   Wt 275 lb (124.7 kg)   BMI 43.07 kg/m  GENERAL APPEARANCE:  Well appearing, well developed, well nourished, NAD HEENT:  Atraumatic, normocephalic, oropharynx clear NECK:  Supple without lymphadenopathy or thyromegaly ABDOMEN:  Soft, non-tender, no masses EXTREMITIES:  Moves all extremities well, without clubbing, cyanosis, or edema NEUROLOGIC:  Alert and oriented x 3, normal gait, CN II-XII grossly intact MENTAL STATUS:  appropriate BACK:  Non-tender to palpation, No CVAT SKIN:  Warm, dry, and intact  Results: U/A: 0-5 WBCs, 3-10 RBCs, many bacteria, nitrite positive

## 2024-06-26 ENCOUNTER — Ambulatory Visit: Payer: Self-pay | Admitting: Urology

## 2024-06-26 DIAGNOSIS — N390 Urinary tract infection, site not specified: Secondary | ICD-10-CM

## 2024-06-26 LAB — URINE CULTURE

## 2024-06-26 MED ORDER — NITROFURANTOIN MONOHYD MACRO 100 MG PO CAPS
100.0000 mg | ORAL_CAPSULE | Freq: Every day | ORAL | 2 refills | Status: DC
Start: 2024-06-26 — End: 2024-10-08

## 2024-06-26 MED ORDER — NITROFURANTOIN MONOHYD MACRO 100 MG PO CAPS: 100.0000 mg | ORAL_CAPSULE | Freq: Two times a day (BID) | ORAL | 0 refills | Status: DC

## 2024-09-22 ENCOUNTER — Ambulatory Visit: Admitting: Urology

## 2024-10-08 ENCOUNTER — Ambulatory Visit: Admitting: Urology

## 2024-10-08 ENCOUNTER — Encounter: Payer: Self-pay | Admitting: Urology

## 2024-10-08 VITALS — BP 118/75 | HR 83

## 2024-10-08 DIAGNOSIS — R8290 Unspecified abnormal findings in urine: Secondary | ICD-10-CM

## 2024-10-08 DIAGNOSIS — Z87440 Personal history of urinary (tract) infections: Secondary | ICD-10-CM

## 2024-10-08 DIAGNOSIS — N390 Urinary tract infection, site not specified: Secondary | ICD-10-CM

## 2024-10-08 LAB — MICROSCOPIC EXAMINATION: Epithelial Cells (non renal): >10 /HPF — AB (ref 0–10)

## 2024-10-08 LAB — URINALYSIS, ROUTINE W REFLEX MICROSCOPIC
Bilirubin, UA: NEGATIVE
Glucose, UA: NEGATIVE
Nitrite, UA: POSITIVE — AB
Specific Gravity, UA: 1.015 (ref 1.005–1.030)
Urobilinogen, Ur: 0.2 mg/dL (ref 0.2–1.0)
pH, UA: 5.5 (ref 5.0–7.5)

## 2024-10-08 NOTE — Progress Notes (Signed)
 Assessment: 1. Frequent UTI   2. Abnormal urine findings     Plan: Continue methods to reduce the risk of UTIs including increased fluid intake, timed and double voiding, daily cranberry supplement, daily probiotic, and vaginal hormone replacement. Continue daily macrodantin  Urine culture sent today for documentation purposes since she is currently asymptomatic. Return to office in 3 months   Chief Complaint:  Chief Complaint  Patient presents with   Frequent UTI    History of Present Illness:  Becky James is a 68 y.o. female who is seen for continued evaluation of recurrent UTI's. She reported frequent UTIs for the past year.  These UTI's have occurred approximately monthly.  Typical symptoms include dysuria, frequency, urgency, and bladder pressure.  No gross hematuria or flank pain.  No fevers or chills.  Her symptoms typically resolve with antibiotics but return shortly after she completes the antibiotics.  At her visit in 11/24, she was having some slight dysuria, frequency, and urgency.  Urine culture results: 6/21 >100K  E. Coli 7/22 >100K  E. Coli 8/22 >100K  Citrobacter 6/23 >100K  E. Coli 9/23 >100K  E. Coli 12/23 >100K  E. Coli 9/24 >100K  E. Coli 10/24 >100K  Proteus 11/24 >100K  E. Coli 11/24 >100K E. Coli (ESBL) - treated with Augmentin   She does report problems with constipation.  She is followed by nephrology for stage III kidney failure. CT imaging from 12/22 showed no renal or ureteral calculi.  At her visit in 12/24, she continued on daily Macrodantin  for UTI prevention.  No UTI symptoms. At her visit in 2/25, she had not had any recent UTI symptoms.  No dysuria or gross hematuria.  She continued taking the daily Macrobid .  She had noted some slight vaginal irritation and itching.  No discharge. She continued on the daily Macrobid  until her supply ran out.  She was treated for yeast vaginitis with Monistat .  She ran out of the daily Macrobid   approximately 1 week prior to her visit in March 2025.  She had some frequency, urgency, intermittent stream.  No dysuria or gross hematuria. Urine culture grew >100K E. Coli.  Treated with macrobid  and restarted on daily Macrobid .  At her visit in May 2025, she continued on daily Macrodantin .  No dysuria or gross hematuria.  She reported frequency, urgency, and urge incontinence.   [facility] urine culture from 04/22/24:  Proteus.  Treated with Cefdinir  x 7 days and started on daily Keflex .  At her visit in July 2025, she continued on daily cephalexin .  She was not having any UTI symptoms.  No frequency, dysuria, or gross hematuria.  No flank pain. Urine culture grew 50-100 K E. coli.  She was treated with Macrobid  x 7 days and started on daily Macrobid  for UTI prevention.  She returns today for follow-up.  She completed 3 months of daily Macrobid  approximately 1 week ago.  She has been off of antibiotics for approximately 1 week.  She is not having any UTI symptoms at the present time.  No dysuria or gross hematuria.  Portions of the above documentation were copied from a prior visit for review purposes only.  Past Medical History:  Past Medical History:  Diagnosis Date   Back pain    Diabetes mellitus without complication (HCC)    Hypertension    Vertigo     Past Surgical History:  Past Surgical History:  Procedure Laterality Date   ABDOMINAL HYSTERECTOMY     BACK SURGERY  WRIST SURGERY      Allergies:  Allergies  Allergen Reactions   Empagliflozin Other (See Comments)    Frequent UTI's, yeast infections and perianal fungal appearing rash.   Furosemide Other (See Comments)    Other Reaction(s): Gout  Causes gout attacks/flares   Gabapentin    Rosuvastatin     Other Reaction(s): Headache, Other (See Comments)    Family History:  No family history on file.  Social History:  Social History   Tobacco Use   Smoking status: Every Day    Current packs/day: 0.50     Types: Cigarettes   Smokeless tobacco: Never  Substance Use Topics   Alcohol use: Never   Drug use: Never    ROS: Constitutional:  Negative for fever, chills, weight loss CV: Negative for chest pain, previous MI, hypertension Respiratory:  Negative for shortness of breath, wheezing, sleep apnea, frequent cough GI:  Negative for nausea, vomiting, bloody stool, GERD  Physical exam: BP 118/75   Pulse 83  GENERAL APPEARANCE:  Well appearing, well developed, well nourished, NAD HEENT:  Atraumatic, normocephalic, oropharynx clear NECK:  Supple without lymphadenopathy or thyromegaly ABDOMEN:  Soft, non-tender, no masses EXTREMITIES:  Moves all extremities well, without clubbing, cyanosis, or edema NEUROLOGIC:  Alert and oriented x 3, in wheelchair, CN II-XII grossly intact MENTAL STATUS:  appropriate BACK:  Non-tender to palpation, No CVAT SKIN:  Warm, dry, and intact  Results: U/A: 6-10 WBC, 0-2 RBC, >10 epithelial cells, many bacteria, nitrite +

## 2024-10-10 LAB — URINE CULTURE

## 2024-10-13 ENCOUNTER — Ambulatory Visit: Payer: Self-pay | Admitting: Urology

## 2025-01-08 ENCOUNTER — Ambulatory Visit: Admitting: Urology
